# Patient Record
Sex: Male | Born: 1960 | Race: White | Hispanic: No | Marital: Married
Health system: Southern US, Community
[De-identification: ages and names within clinical notes are randomized; demographics above are authoritative.]

---

## 2008-06-20 ENCOUNTER — Encounter: Admission: RE | Admit: 2008-06-20 | Discharge: 2008-06-20 | Payer: Self-pay | Admitting: Family Medicine

## 2008-07-21 ENCOUNTER — Encounter: Admission: RE | Admit: 2008-07-21 | Discharge: 2008-07-21 | Payer: Self-pay | Admitting: Specialist

## 2010-04-06 IMAGING — CR DG FOOT COMPLETE 3+V*L*
3 series · 3 of 3 positions shown · non-contrast
Comparison: None

CLINICAL DATA: foot pain

LEFT FOOT - COMPLETE 3+ VIEW

[view not recorded (1 of 3)]
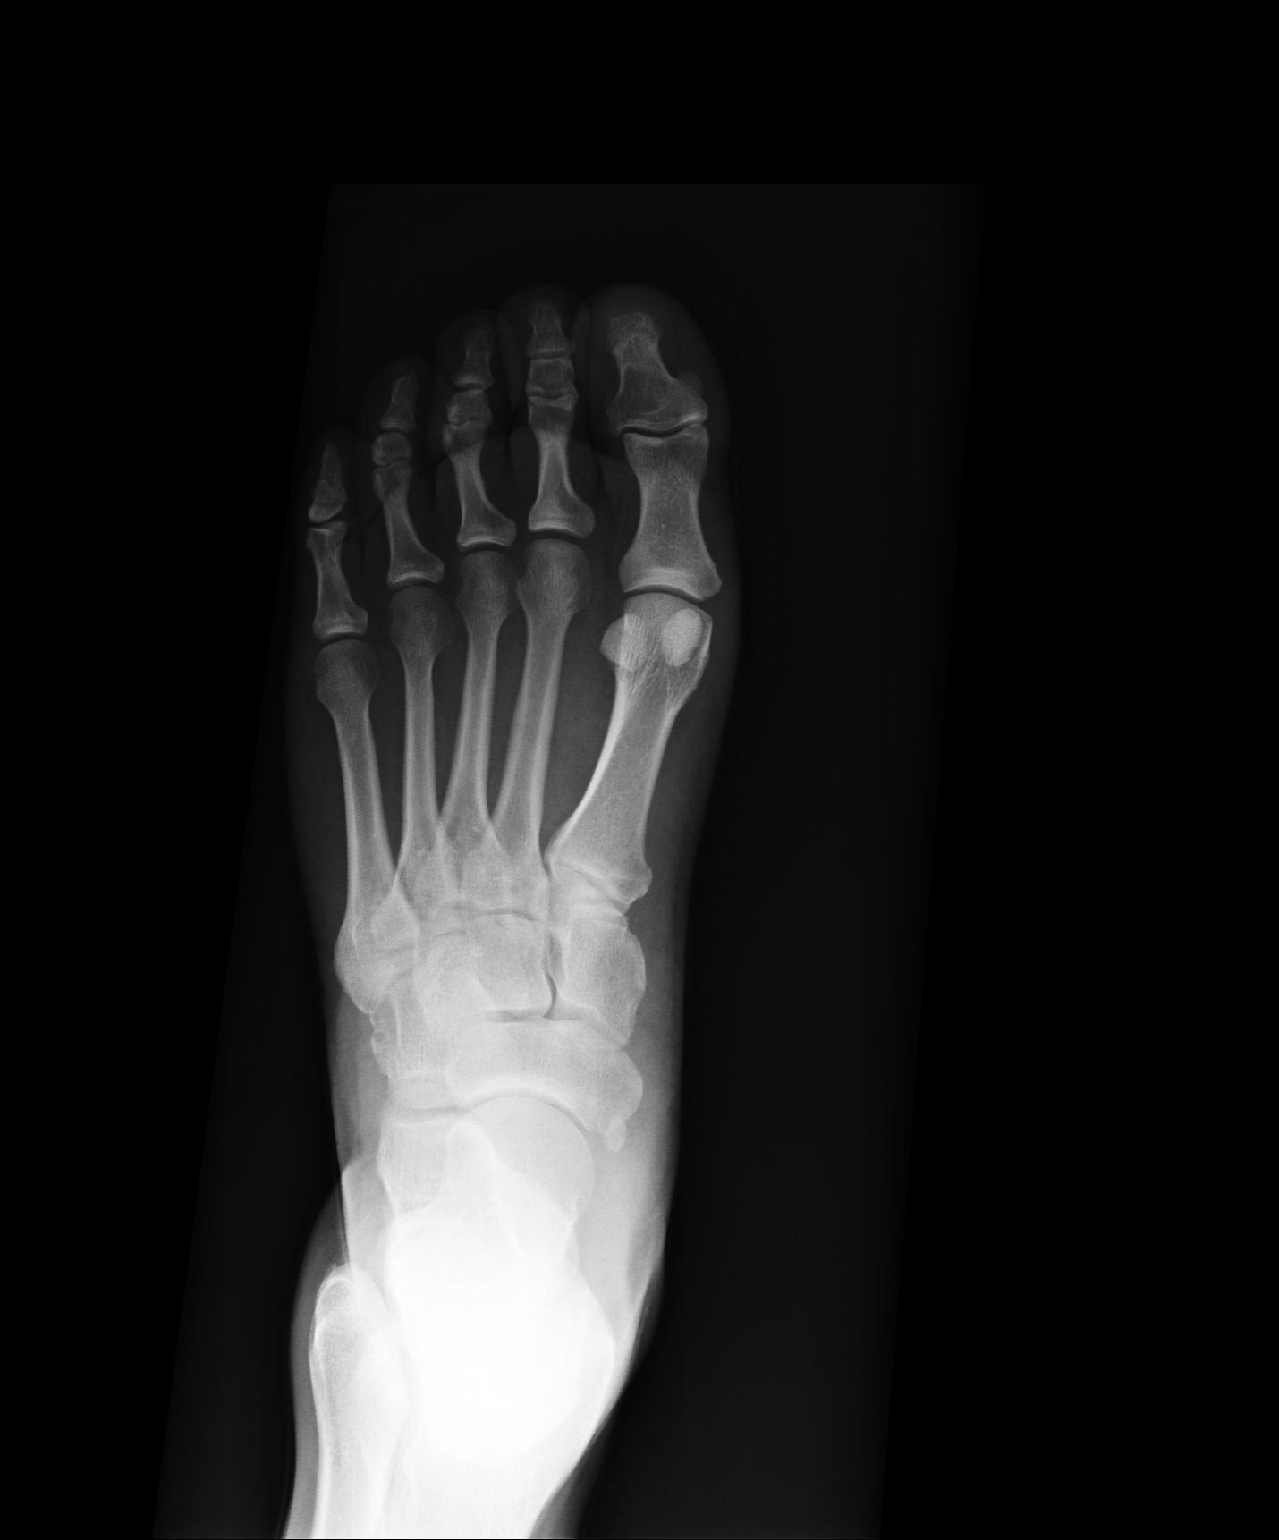

[view not recorded (2 of 3)]
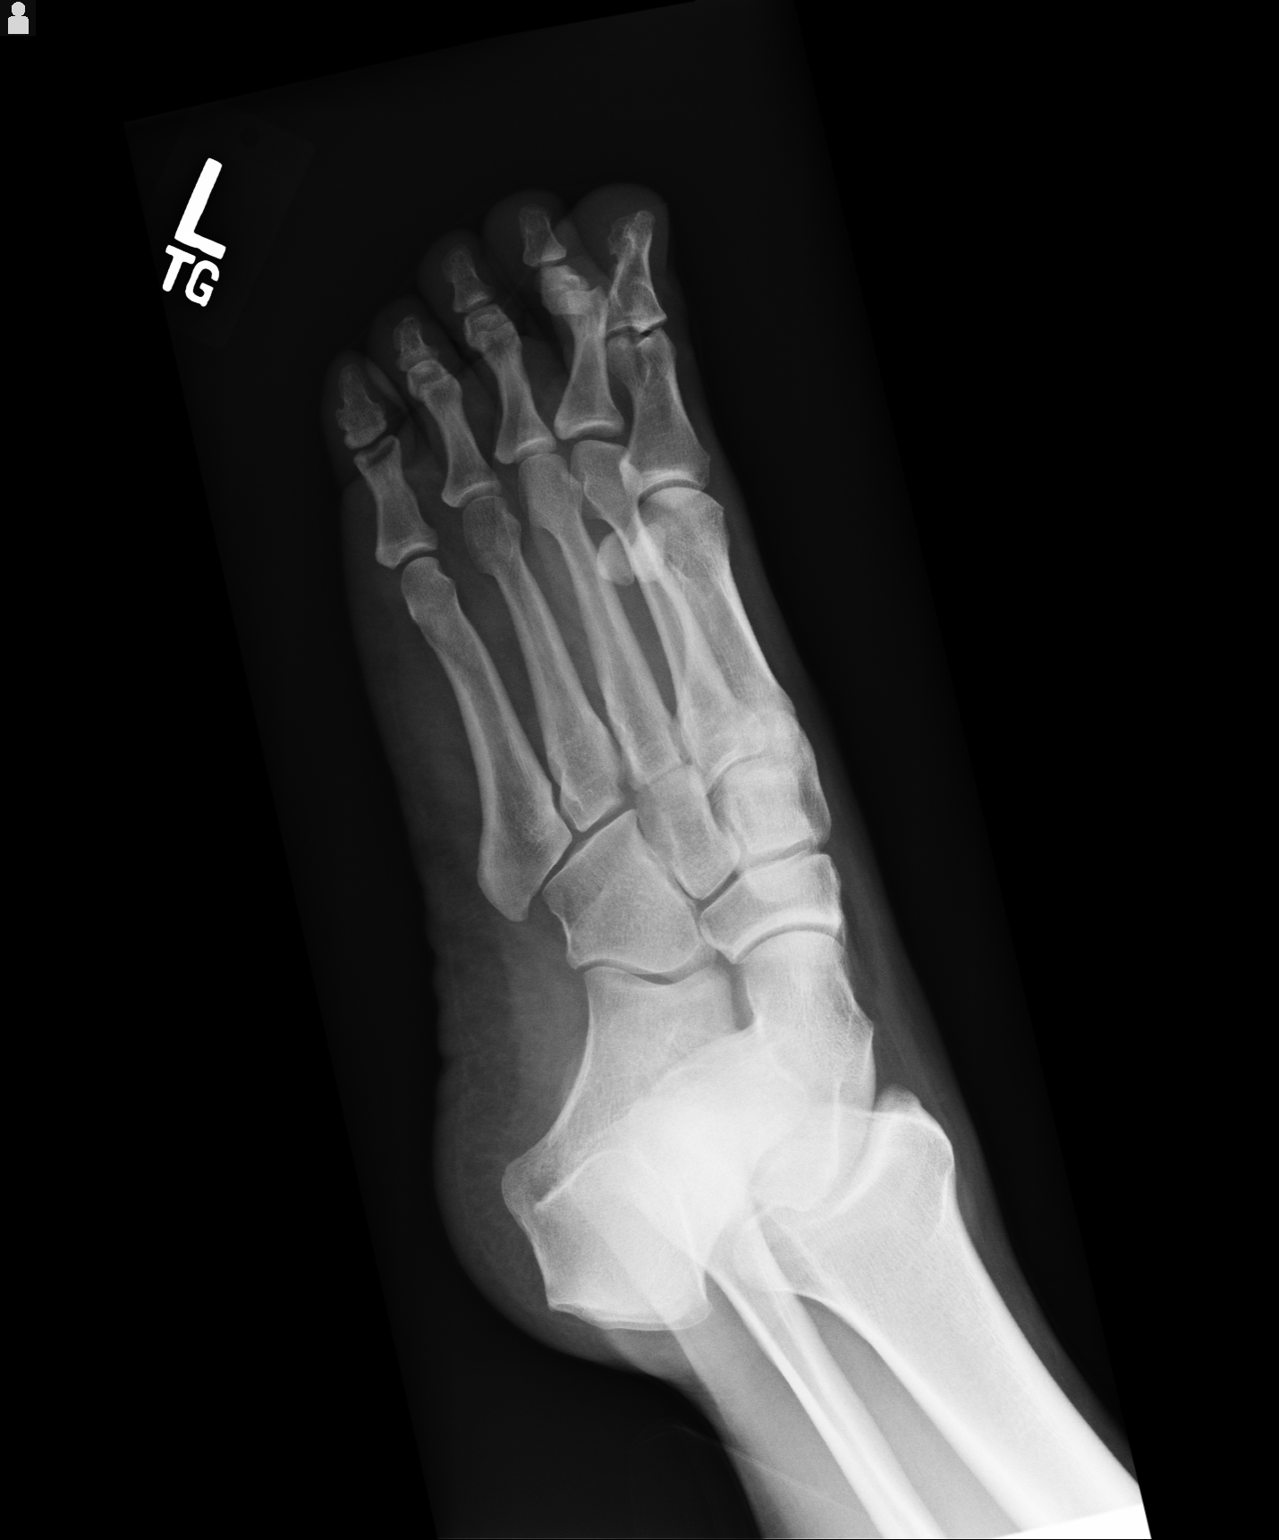

[view not recorded (3 of 3)]
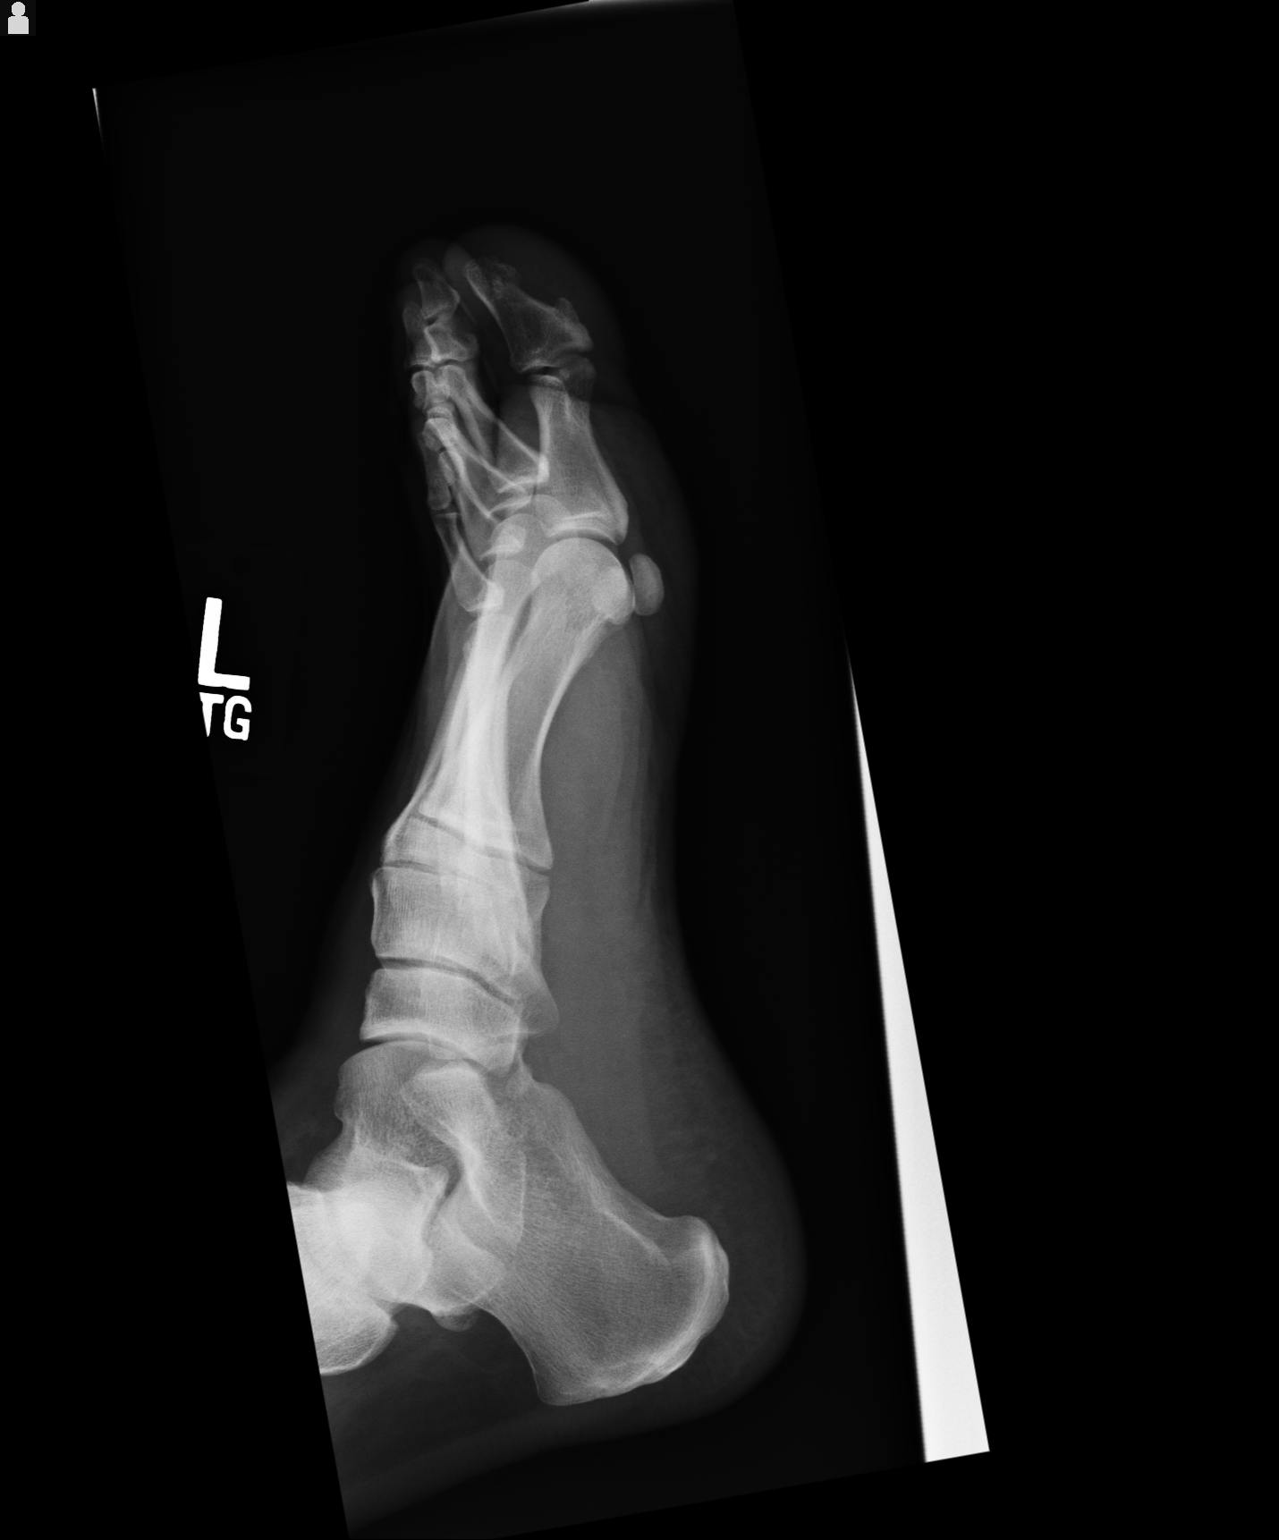

[3 of 3 positions shown; findings below may reference images not displayed]

FINDINGS: Three views submitted.  No acute fracture or subluxation.
No radiopaque foreign body.
IMPRESSION: No left foot acute fracture or subluxation.

## 2010-05-07 IMAGING — CR DG CHEST 1V
1 series · 1 of 1 positions shown · non-contrast
Comparison: None

CLINICAL DATA: Positive PPD test.

CHEST - 1 VIEW

[view not recorded]
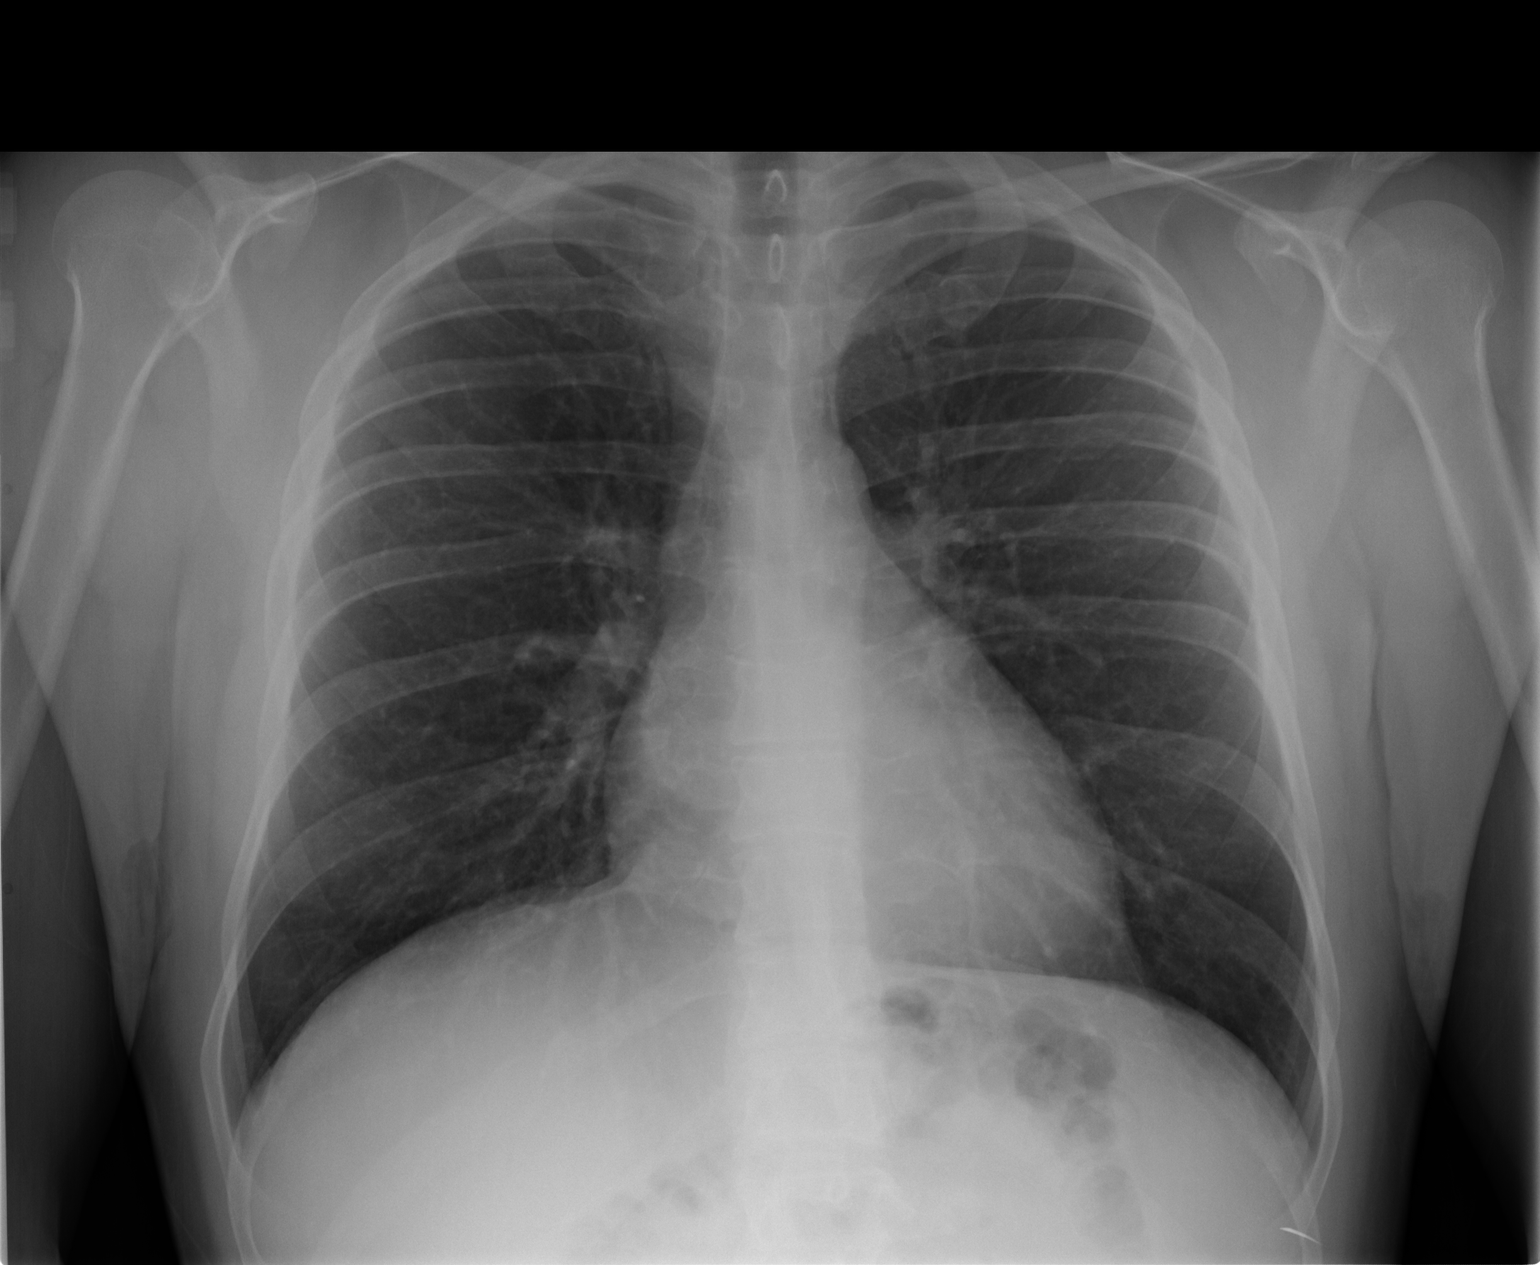

[1 of 1 positions shown; findings below may reference images not displayed]

FINDINGS: Trachea is midline.  Heart size normal.  Lungs are clear.
No pleural fluid.
IMPRESSION: No acute findings.  No evidence of active tuberculosis.

## 2019-11-18 ENCOUNTER — Encounter: Payer: Self-pay | Admitting: Nurse Practitioner

## 2019-11-18 NOTE — Progress Notes (Signed)
This encounter was created in error - please disregard.
